# Patient Record
Sex: Male | Born: 1995 | Hispanic: Yes | Marital: Married | Smoking: Never smoker
Health system: Southern US, Community
[De-identification: ages and names within clinical notes are randomized; demographics above are authoritative.]

## PROBLEM LIST (undated history)

## (undated) DIAGNOSIS — G4730 Sleep apnea, unspecified: Secondary | ICD-10-CM

---
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
## 2018-04-20 ENCOUNTER — Encounter: Payer: Self-pay | Admitting: Gynecology

## 2018-04-20 ENCOUNTER — Other Ambulatory Visit: Payer: Self-pay

## 2018-04-20 ENCOUNTER — Ambulatory Visit
Admission: EM | Admit: 2018-04-20 | Discharge: 2018-04-20 | Disposition: A | Discharge status: Home / Self Care | Payer: BLUE CROSS/BLUE SHIELD | Attending: Family Medicine | Admitting: Family Medicine

## 2018-04-20 DIAGNOSIS — J0100 Acute maxillary sinusitis, unspecified: Secondary | ICD-10-CM

## 2018-04-20 MED ORDER — AMOXICILLIN-POT CLAVULANATE 875-125 MG PO TABS: 1.0000 | ORAL_TABLET | Freq: Two times a day (BID) | ORAL | 0 refills | Status: DC

## 2018-04-20 NOTE — ED Triage Notes (Signed)
Per patient with cough x 2 weeks

## 2018-04-20 NOTE — ED Provider Notes (Signed)
[facility] ____________________________________________  Time seen: Approximately 11:24 AM  I have reviewed the triage vital signs and the nursing notes.   HISTORY  Chief Complaint No chief complaint on file.   HPI Domingo CockingLuis A Waddington is a 22 y.o. male present for evaluation of 2 weeks of runny nose, nasal congestion, postnasal drainage and cough.  States cough is day and night very thick yellow mucus out with blowing his nose.  Reports intermittent sinus pressure.  Had sore throat initially, sore throat is since resolved.  States mild sinus discomfort currently.  Continues to eat and drink well.  States he feels like he had a fever last week, but none the last few days.  States his wife is also now sick with some similar complaints.  Denies other known sick contacts.  Has been using multiple over-the-counter cough and congestion medications with some improvement but no resolution.  Denies other relieving factors reports otherwise doing well.  Denies chronic medical problems.  Denies chest pain or shortness of breath.    No past medical history on file. Denies   There are no active problems to display for this patient.   History reviewed. No pertinent surgical history.   No current facility-administered medications for this encounter.   Current Outpatient Medications:  .  amoxicillin-clavulanate (AUGMENTIN) 875-125 MG tablet, Take 1 tablet by mouth every 12 (twelve) hours., Disp: 20 tablet, Rfl: 0  Allergies Patient has no known allergies.  History reviewed. No pertinent family history.  Social History Social History   Tobacco Use  . Smoking status: Never Smoker  . Smokeless tobacco: Never Used  Substance Use Topics  . Alcohol use: Never    Frequency: Never  . Drug use: Never    Review of Systems Constitutional: No fever ENT: As above.  Cardiovascular: Denies chest pain. Respiratory: Denies shortness of breath. Gastrointestinal: No abdominal pain.     Musculoskeletal: Negative for back pain. Skin: Negative for rash.   ____________________________________________   PHYSICAL EXAM:  VITAL SIGNS: ED Triage Vitals  Enc Vitals Group     BP 04/20/18 1111 120/77     Pulse Rate 04/20/18 1111 73     Resp 04/20/18 1111 16     Temp 04/20/18 1111 97.6 F (36.4 C)     Temp Source 04/20/18 1111 Oral     SpO2 04/20/18 1111 100 %     Weight 04/20/18 1113 139 lb (63 kg)     Height 04/20/18 1113 5\' 6"  (1.676 m)     Head Circumference --      Peak Flow --      Pain Score 04/20/18 1112 8     Pain Loc --      Pain Edu? --      Excl. in GC? --     Constitutional: Alert and oriented. Well appearing and in no acute distress. Eyes: Conjunctivae are normal.  Head: Atraumatic.No tenderness to palpation bilateral frontal and maxillary sinuses. No swelling. No erythema.   Ears: no erythema, normal TMs bilaterally.   Nose: nasal congestion with bilateral nasal turbinate erythema and edema.   Mouth/Throat: Mucous membranes are moist. Oropharynx non-erythematous. No tonsillar swelling or exudate.  Neck: No stridor.  No cervical spine tenderness to palpation. Hematological/Lymphatic/Immunilogical: No cervical lymphadenopathy. Cardiovascular: Normal rate, regular rhythm. Grossly normal heart sounds.  Good peripheral circulation. Respiratory: Normal respiratory effort.  No retractions. LNo wheezes, rales or rhonchi. Good air movement.  Musculoskeletal:Steady gait.  Neurologic:  Normal speech and language. No gait  instability. Skin:  Skin is warm, dry and intact. No rash noted. Psychiatric: Mood and affect are normal. Speech and behavior are normal.  ___________________________________________   LABS (all labs ordered are listed, but only abnormal results are displayed)  Labs Reviewed - No data to display ____________________________________________   PROCEDURES Procedures    INITIAL IMPRESSION / ASSESSMENT AND PLAN / ED COURSE  Pertinent  labs & imaging results that were available during my care of the patient were reviewed by me and considered in my medical decision making (see chart for details).  Well appearing patient.  No acute distress.  Concern for secondary sinusitis.  Will treat with oral Augmentin.  Continue over-the-counter cough and congestion medication.Discussed indication, risks and benefits of medications with patient.  Discussed follow up with Primary care physician this week. Discussed follow up and return parameters including no resolution or any worsening concerns. Patient verbalized understanding and agreed to plan.   ____________________________________________   FINAL CLINICAL IMPRESSION(S) / ED DIAGNOSES  Final diagnoses:  Acute maxillary sinusitis, recurrence not specified     ED Discharge Orders         Ordered    amoxicillin-clavulanate (AUGMENTIN) 875-125 MG tablet  Every 12 hours     04/20/18 1122           Note: This dictation was prepared with Dragon dictation along with smaller phrase technology. Any transcriptional errors that result from this process are unintentional.         Renford Dills, NP 04/20/18 1127

## 2018-04-20 NOTE — Discharge Instructions (Addendum)
Take medication as prescribed. Rest. Drink plenty of fluids.  ° °Follow up with your primary care physician this week as needed. Return to Urgent care for new or worsening concerns.  ° °

## 2022-02-28 ENCOUNTER — Other Ambulatory Visit: Payer: Self-pay | Admitting: Nurse Practitioner

## 2022-02-28 DIAGNOSIS — N5089 Other specified disorders of the male genital organs: Secondary | ICD-10-CM

## 2022-03-07 ENCOUNTER — Other Ambulatory Visit: Payer: BLUE CROSS/BLUE SHIELD

## 2022-10-29 ENCOUNTER — Ambulatory Visit
Admission: EM | Admit: 2022-10-29 | Discharge: 2022-10-29 | Disposition: A | Discharge status: Home / Self Care | Payer: BC Managed Care – PPO | Attending: Family | Admitting: Family

## 2022-10-29 ENCOUNTER — Telehealth: Payer: Self-pay | Admitting: Internal Medicine

## 2022-10-29 ENCOUNTER — Ambulatory Visit
Admission: RE | Admit: 2022-10-29 | Discharge: 2022-10-29 | Disposition: A | Discharge status: Home / Self Care | Payer: BC Managed Care – PPO | Source: Ambulatory Visit | Attending: Family | Admitting: Family

## 2022-10-29 DIAGNOSIS — N50811 Right testicular pain: Secondary | ICD-10-CM | POA: Diagnosis not present

## 2022-10-29 DIAGNOSIS — N4342 Spermatocele of epididymis, multiple: Secondary | ICD-10-CM | POA: Diagnosis not present

## 2022-10-29 DIAGNOSIS — I861 Scrotal varices: Secondary | ICD-10-CM | POA: Insufficient documentation

## 2022-10-29 NOTE — Discharge Instructions (Signed)
We have ordered an ultrasound to help determine the cause of your discomfort. We will notify you of the results. If the ultrasound is normal and does not show a specific cause for your pain, we recommend you call a Urologist (information provided) here in [place] for further evaluation.

## 2022-10-29 NOTE — ED Triage Notes (Signed)
Pt c/o R testicle pain x1 mon. States pain getting worse especially when moving around, denies any heavy lifting or injury to area.

## 2022-10-29 NOTE — ED Provider Notes (Signed)
[facility]    CSN: [id number] Arrival date & time: 10/29/22  1021      History   Chief Complaint Chief Complaint  Patient presents with   Testicle Pain    HPI Bryce Levy is a 27 y.o. male.   27 year old male presents with right upper testicular discomfort for the past month. He has noticed occasional pain/tugging sensation with certain movements, especially in the past few days when trying to throw a baseball with his son. He denies any distinct trauma or injury to the area. No distinct heavy lifting. No dysuria, hematuria or penile discharge. No visible masses. He has not taken any medication for symptoms. He did try applying ice and cool compresses to the area with some relief. No other chronic health issues. Takes no daily medication.   The history is provided by the patient.    History reviewed. No pertinent past medical history.  There are no problems to display for this patient.   History reviewed. No pertinent surgical history.     Home Medications    Prior to Admission medications   Not on File    Family History History reviewed. No pertinent family history.  Social History Social History   Tobacco Use   Smoking status: Never   Smokeless tobacco: Never  Substance Use Topics   Alcohol use: Never   Drug use: Never     Allergies   Patient has no known allergies.   Review of Systems Review of Systems  Constitutional:  Negative for activity change, appetite change, chills, diaphoresis, fatigue and fever.  Gastrointestinal:  Negative for abdominal pain, nausea and vomiting.  Genitourinary:  Positive for testicular pain (right only). Negative for decreased urine volume, difficulty urinating, dysuria, flank pain, frequency, genital sores, hematuria, penile discharge, penile pain, penile swelling, scrotal swelling and urgency.  Musculoskeletal:  Negative for arthralgias, back pain and myalgias.  Skin:  Negative for color change, rash and  wound.  Allergic/Immunologic: Negative for environmental allergies, food allergies and immunocompromised state.  Neurological:  Negative for dizziness, tremors, seizures, syncope, weakness, light-headedness, numbness and headaches.  Hematological:  Negative for adenopathy. Does not bruise/bleed easily.     Physical Exam Triage Vital Signs ED Triage Vitals  Enc Vitals Group     BP 10/29/22 1026 (!) 145/73     Pulse Rate 10/29/22 1026 70     Resp 10/29/22 1026 16     Temp 10/29/22 1026 98.6 F (37 C)     Temp Source 10/29/22 1026 Oral     SpO2 10/29/22 1026 95 %     Weight 10/29/22 1026 150 lb (68 kg)     Height 10/29/22 1026 5\' 6"  (1.676 m)     Head Circumference --      Peak Flow --      Pain Score 10/29/22 1030 8     Pain Loc --      Pain Edu? --      Excl. in GC? --    No data found.  Updated Vital Signs BP (!) 145/73 (BP Location: Left Arm)   Pulse 70   Temp 98.6 F (37 C) (Oral)   Resp 16   Ht 5\' 6"  (1.676 m)   Wt 150 lb (68 kg)   SpO2 95%   BMI 24.21 kg/m   Visual Acuity Right Eye Distance:   Left Eye Distance:   Bilateral Distance:    Right Eye Near:   Left Eye Near:  Bilateral Near:     Physical Exam Vitals and nursing note reviewed. Exam conducted with a chaperone present Lars Mage, CMA was present during genital exam).  Constitutional:      General: He is awake. He is not in acute distress.    Appearance: He is well-developed and well-groomed. He is not ill-appearing.     Comments: He is sitting in the exam chair in no acute distress.   HENT:     Head: Normocephalic and atraumatic.     Right Ear: Hearing normal.     Left Ear: Hearing normal.  Cardiovascular:     Rate and Rhythm: Normal rate.  Pulmonary:     Effort: Pulmonary effort is normal.  Abdominal:     General: Abdomen is flat. Bowel sounds are normal. There is no distension.     Palpations: Abdomen is soft. There is no mass.     Tenderness: There is no abdominal tenderness. There is no  right CVA tenderness, left CVA tenderness, guarding or rebound.     Hernia: No hernia is present. There is no hernia in the left inguinal area or right inguinal area.  Genitourinary:    Pubic Area: No rash.      Penis: Normal and circumcised. No erythema, tenderness, discharge, swelling or lesions.      Testes:        Right: Tenderness present. Mass, swelling or varicocele not present. Right testis is descended.        Left: Mass, tenderness, swelling or varicocele not present. Left testis is descended.     Epididymis:     Right: Normal.     Left: Normal.     Comments: No distinct hernia present. Tender along lower right inguinal canal into right upper scrotum and right testicle. No distinct masses present. No rash or lesions.  Musculoskeletal:        General: Normal range of motion.  Lymphadenopathy:     Lower Body: No right inguinal adenopathy. No left inguinal adenopathy.  Skin:    General: Skin is warm and dry.     Findings: No erythema or rash.  Neurological:     General: No focal deficit present.     Mental Status: He is alert and oriented to person, place, and time.  Psychiatric:        Mood and Affect: Mood normal.        Behavior: Behavior normal. Behavior is cooperative.        Thought Content: Thought content normal.        Judgment: Judgment normal.      UC Treatments / Results  Labs (all labs ordered are listed, but only abnormal results are displayed) Labs Reviewed - No data to display  EKG   Radiology US SCROTUM W/DOPPLER  Result Date: 10/29/2022 CLINICAL DATA:  27 year old with right scrotal pain for 1 month, worsening over the last 3 days. EXAM: SCROTAL ULTRASOUND DOPPLER ULTRASOUND OF THE TESTICLES TECHNIQUE: Complete ultrasound examination of the testicles, epididymis, and other scrotal structures was performed. Color and spectral Doppler ultrasound were also utilized to evaluate blood flow to the testicles. COMPARISON:  None Available. FINDINGS: Right  testicle Measurements: 4.2 x 2.4 x 2.2 cm. No mass or microlithiasis visualized. Normal blood flow with color Doppler. Left testicle Measurements: 3.9 x 1.9 x 2.9 cm. No mass or microlithiasis visualized. Normal blood flow with color Doppler. Right epididymis: Small epididymal cysts or spermatoceles, measuring up to 7 mm in diameter. Otherwise unremarkable. Left epididymis: Small epididymal  cysts or spermatoceles, measuring up to 12 mm in diameter. Otherwise unremarkable. Hydrocele:  None visualized. Varicocele:  Small left-sided varicocele. Pulsed Doppler interrogation of both testes demonstrates normal low resistance arterial and venous waveforms bilaterally. IMPRESSION: 1. No evidence of testicular torsion.  Both testes appear normal. 2. Small bilateral epididymal cysts or spermatoceles. 3. Small left-sided varicocele. Electronically Signed   By: Carey Bullocks M.D.   On: 10/29/2022 16:31    Procedures Procedures (including critical care time)  Medications Ordered in UC Medications - No data to display  Initial Impression / Assessment and Plan / UC Course  I have reviewed the triage vital signs and the nursing notes.  Pertinent labs & imaging results that were available during my care of the patient were reviewed by me and considered in my medical decision making (see chart for details).     Discussed with patient that we will order a ultrasound of the scrotum to help determine any cause of his discomfort. Patient will be worked into the schedule at [employer] this afternoon. If a more emergent cause of his symptoms is determined by ultrasound, we will notify him today of the results and may either send him to the ER or to a surgeon. If his ultrasound is normal or a non-emergent finding is present, we will notify him tomorrow 6/18 and he may need to see a Urologist (information provided).   Final Clinical Impressions(s) / UC Diagnoses   Final diagnoses:  Right testicular pain      Discharge Instructions      We have ordered an ultrasound to help determine the cause of your discomfort. We will notify you of the results. If the ultrasound is normal and does not show a specific cause for your pain, we recommend you call a Urologist (information provided) here in [place] for further evaluation.     ED Prescriptions   None    PDMP not reviewed this encounter.   Sudie Grumbling, NP 10/29/22 2111

## 2022-10-29 NOTE — Telephone Encounter (Signed)
Patient did not pick up his phone when I called today 10/29/2022 at 4:58 PM.  I was hoping to update the patient with the results of the testicular ultrasound.

## 2022-10-30 NOTE — Telephone Encounter (Signed)
Per Dewayne Hatch, APP "his Ultrasound did show a small Varicocele (basically like a varicose vein) on the right side which may be contributing to his pain. It also showed small bilateral epididymal cysts which are usually not concerning but still should be monitored. No results requiring urgent evaluation. I would recommend he follow-up with a Urologist for further evaluation. "  Reviewed with patient, he verbalized understanding

## 2022-11-09 ENCOUNTER — Ambulatory Visit
Admission: EM | Admit: 2022-11-09 | Discharge: 2022-11-09 | Disposition: A | Discharge status: Home / Self Care | Payer: BC Managed Care – PPO | Attending: Emergency Medicine | Admitting: Emergency Medicine

## 2022-11-09 ENCOUNTER — Other Ambulatory Visit: Payer: Self-pay

## 2022-11-09 DIAGNOSIS — L03115 Cellulitis of right lower limb: Secondary | ICD-10-CM | POA: Diagnosis not present

## 2022-11-09 DIAGNOSIS — W57XXXA Bitten or stung by nonvenomous insect and other nonvenomous arthropods, initial encounter: Secondary | ICD-10-CM

## 2022-11-09 DIAGNOSIS — N50811 Right testicular pain: Secondary | ICD-10-CM

## 2022-11-09 DIAGNOSIS — S80861A Insect bite (nonvenomous), right lower leg, initial encounter: Secondary | ICD-10-CM

## 2022-11-09 MED ORDER — DOXYCYCLINE HYCLATE 100 MG PO CAPS: 100.0000 mg | ORAL_CAPSULE | Freq: Two times a day (BID) | ORAL | 0 refills | Status: DC

## 2022-11-09 MED ORDER — TRIAMCINOLONE ACETONIDE 0.5 % EX OINT: 1.0000 | TOPICAL_OINTMENT | Freq: Two times a day (BID) | CUTANEOUS | 0 refills | Status: DC

## 2022-11-09 NOTE — ED Provider Notes (Signed)
[facility]    CSN: [id number] Arrival date & time: 11/09/22  1850      History   Chief Complaint Chief Complaint  Patient presents with   Insect Bite    HPI Bryce Levy is a 27 y.o. male.   HPI  27 year old male with no significant past medical history presents for evaluation of an itchy, painful, red insect bite to the back of his right calf.  He states he was walking through tall grass and shorts several days ago and he felt a sting on the back of his leg but he did not see what bit him.  The stinging lasted approximately a minute.  The area has continued to be red, tender, and itching.  He denies any fever or drainage from the area.  History reviewed. No pertinent past medical history.  There are no problems to display for this patient.   History reviewed. No pertinent surgical history.     Home Medications    Prior to Admission medications   Medication Sig Start Date End Date Taking? Authorizing Provider  doxycycline (VIBRAMYCIN) 100 MG capsule Take 1 capsule (100 mg total) by mouth 2 (two) times daily. 11/09/22  Yes Becky Augusta, NP  triamcinolone ointment (KENALOG) 0.5 % Apply 1 Application topically 2 (two) times daily. 11/09/22  Yes Becky Augusta, NP    Family History History reviewed. No pertinent family history.  Social History Social History   Tobacco Use   Smoking status: Never   Smokeless tobacco: Never  Substance Use Topics   Alcohol use: Never   Drug use: Never     Allergies   Patient has no known allergies.   Review of Systems Review of Systems  Constitutional:  Negative for fever.  Skin:  Positive for color change and wound.     Physical Exam Triage Vital Signs ED Triage Vitals [11/09/22 1901]  Enc Vitals Group     BP      Pulse      Resp      Temp      Temp src      SpO2      Weight 152 lb (68.9 kg)     Height 5\' 6"  (1.676 m)     Head Circumference      Peak Flow      Pain Score 5     Pain Loc      Pain  Edu?      Excl. in GC?    No data found.  Updated Vital Signs BP 124/77 (BP Location: Left Arm)   Pulse 76   Temp 99.1 F (37.3 C) (Oral)   Ht 5\' 6"  (1.676 m)   Wt 152 lb (68.9 kg)   SpO2 95%   BMI 24.53 kg/m   Visual Acuity Right Eye Distance:   Left Eye Distance:   Bilateral Distance:    Right Eye Near:   Left Eye Near:    Bilateral Near:     Physical Exam Vitals and nursing note reviewed.  Constitutional:      Appearance: Normal appearance. He is not ill-appearing.  HENT:     Head: Normocephalic and atraumatic.  Skin:    General: Skin is warm and dry.     Capillary Refill: Capillary refill takes less than 2 seconds.     Findings: Erythema and lesion present.  Neurological:     General: No focal deficit present.     Mental Status: He is alert and oriented to  person, place, and time.  Psychiatric:        Mood and Affect: Mood normal.        Behavior: Behavior normal.        Thought Content: Thought content normal.        Judgment: Judgment normal.      UC Treatments / Results  Labs (all labs ordered are listed, but only abnormal results are displayed) Labs Reviewed - No data to display  EKG   Radiology No results found.  Procedures Procedures (including critical care time)  Medications Ordered in UC Medications - No data to display  Initial Impression / Assessment and Plan / UC Course  I have reviewed the triage vital signs and the nursing notes.  Pertinent labs & imaging results that were available during my care of the patient were reviewed by me and considered in my medical decision making (see chart for details).   Patient is a pleasant, nontoxic-appearing 2 old male presenting for evaluation of an insect bite to the back of his right calf as outlined in HPI above.  There is an erythematous area as you can see in image above that does not have any induration or fluctuance though it is hot to touch and tender.  It is unclear as to what might  admit the patient but the erythema and warmth is concerning for developing cellulitis.  I will start him on doxycycline 100 mg twice daily for 10 days to cover for the developing cellulitis.  I will also prescribe triamcinolone 0.5% ointment that he can apply twice daily to help with itching and inflammation.  Return precautions reviewed.  Final Clinical Impressions(s) / UC Diagnoses   Final diagnoses:  Cellulitis of right lower extremity  Insect bite of right lower leg, initial encounter     Discharge Instructions      Take the Doxycycline twice daily with food for 10 days.  Doxycycline will make you more sensitive to sunburn so wear sunscreen when outdoors and reapply it every 90 minutes.  Apply warm compresses to help promote drainage.  Use OTC Tylenol and Ibuprofen according to the package instructions as needed for pain.  Apply the Triamcinolone ointment twice daily to help with pain, itch, and inflammation.  Return for new or worsening symptoms.       ED Prescriptions     Medication Sig Dispense Auth. Provider   doxycycline (VIBRAMYCIN) 100 MG capsule Take 1 capsule (100 mg total) by mouth 2 (two) times daily. 20 capsule Becky Augusta, NP   triamcinolone ointment (KENALOG) 0.5 % Apply 1 Application topically 2 (two) times daily. 30 g Becky Augusta, NP      PDMP not reviewed this encounter.   Becky Augusta, NP 11/09/22 [phone]

## 2022-11-09 NOTE — Discharge Instructions (Signed)
Take the Doxycycline twice daily with food for 10 days.  Doxycycline will make you more sensitive to sunburn so wear sunscreen when outdoors and reapply it every 90 minutes.  Apply warm compresses to help promote drainage.  Use OTC Tylenol and Ibuprofen according to the package instructions as needed for pain.  Apply the Triamcinolone ointment twice daily to help with pain, itch, and inflammation.  Return for new or worsening symptoms.

## 2022-11-09 NOTE — ED Triage Notes (Signed)
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

## 2022-11-13 ENCOUNTER — Ambulatory Visit (INDEPENDENT_AMBULATORY_CARE_PROVIDER_SITE_OTHER): Payer: BC Managed Care – PPO | Admitting: Urology

## 2022-11-13 ENCOUNTER — Other Ambulatory Visit
Admission: RE | Admit: 2022-11-13 | Discharge: 2022-11-13 | Disposition: A | Discharge status: Home / Self Care | Payer: BC Managed Care – PPO | Attending: Urology | Admitting: Urology

## 2022-11-13 ENCOUNTER — Encounter: Payer: Self-pay | Admitting: Urology

## 2022-11-13 VITALS — BP 108/73 | HR 69 | Ht 66.0 in | Wt 155.0 lb

## 2022-11-13 DIAGNOSIS — N50811 Right testicular pain: Secondary | ICD-10-CM | POA: Diagnosis present

## 2022-11-13 DIAGNOSIS — R109 Unspecified abdominal pain: Secondary | ICD-10-CM

## 2022-11-13 DIAGNOSIS — R102 Pelvic and perineal pain: Secondary | ICD-10-CM | POA: Diagnosis not present

## 2022-11-13 LAB — URINALYSIS, COMPLETE (UACMP) WITH MICROSCOPIC
Bilirubin Urine: NEGATIVE
Glucose, UA: NEGATIVE mg/dL
Hgb urine dipstick: NEGATIVE
Ketones, ur: NEGATIVE mg/dL
Leukocytes,Ua: NEGATIVE
Nitrite: NEGATIVE
Protein, ur: NEGATIVE mg/dL
Specific Gravity, Urine: 1.02 (ref 1.005–1.030)
pH: 7 (ref 5.0–8.0)

## 2022-11-13 MED ORDER — CELECOXIB 200 MG PO CAPS: 200.0000 mg | ORAL_CAPSULE | Freq: Two times a day (BID) | ORAL | 0 refills | Status: DC

## 2022-11-13 NOTE — Patient Instructions (Signed)
 Pelvic Floor Dysfunction, Male     Pelvic floor dysfunction (PFD) is a condition that results when the group of muscles and connective tissues that support the organs in the pelvis (pelvic floor muscles) do not work well. These muscles and their connections form a sling that supports the colon and bladder. In men, these muscles also support the prostate gland. PFD causes pelvic floor muscles to be too weak, too tight, or both. In PFD, muscle movements are not coordinated. This may cause bowel or bladder problems. It may also cause pain. What are the causes? This condition may be caused by an injury to the pelvic area or by a weakening of pelvic muscles. In many cases, the exact cause is not known. What increases the risk? The following factors may make you more likely to develop PFD: Having chronic bladder tissue inflammation (interstitial cystitis). Being an older person. Being overweight. History of radiation treatment for cancer in the pelvic region. Previous pelvic surgery, such as removal of the prostate gland (prostatectomy). What are the signs or symptoms? Symptoms of this condition vary and may include: Bladder symptoms, such as: Trouble starting urination and emptying the bladder. Frequent urinary tract infections. Leaking urine when coughing, laughing, or exercising (stress incontinence). Having to pass urine urgently or frequently. Pain when passing urine. Bowel symptoms, such as: Constipation. Urgent or frequent bowel movements. Incomplete bowel movements. Painful bowel movements. Leaking stool or gas. Unexplained genital or rectal pain. Genital or rectal muscle spasms. Low back pain. Sexual dysfunction, such as erectile dysfunction, premature ejaculation, or pain during or after sexual activity. How is this diagnosed? This condition is diagnosed based on: Your symptoms and medical history. A physical exam. During the exam, your health care provider may check your  pelvic muscles for tightness, spasm, pain, or weakness. This may include a rectal exam. In some cases, you may have diagnostic tests, such as: Electrical muscle function tests. Urine flow testing. X-ray tests of bowel function. Ultrasound of the pelvic organs. How is this treated? Treatment for this condition depends on your symptoms. Treatment options include: Physical therapy. This may include Kegel exercises to help relax or strengthen the pelvic floor muscles. Biofeedback. This type of therapy provides feedback on how tight your pelvic floor muscles are so that you can learn to control them. Massage therapy. A treatment that involves electrical stimulation of the pelvic floor muscles to help control pain (transcutaneous electrical nerve stimulation, or TENS). Sound wave therapy (ultrasound) to reduce muscle spasms. Medicines, such as: Muscle relaxants. Bladder control medicines. Surgery to reconstruct or support pelvic floor muscles may be an option if other treatments do not help. Follow these instructions at home: Activity Do your usual activities as told by your health care provider. Ask your health care provider if you should modify any activities. Do pelvic floor strengthening or relaxing exercises at home as told by your physical therapist. Lifestyle Maintain a healthy weight. Eat foods that are high in fiber, such as beans, whole grains, and fresh fruits and vegetables. Limit foods that are high in fat and processed sugars, such as fried or sweet foods. Manage stress with relaxation techniques such as yoga or meditation. General instructions If you have problems with leakage: Use absorbable pads or wear padded underwear. Wash your genital and anal area frequently with mild soap. Keep your genital and anal area as clean and dry as possible. Ask your health care provider if you should try a barrier cream to prevent skin irritation. Take warm   baths to relieve pelvic muscle  tension or spasms. Take over-the-counter and prescription medicines only as told by your health care provider. Keep all follow-up visits. How is this prevented? The cause of PFD is not always known, but there are a few things you can do to reduce the risk of developing this condition, including: Staying at a healthy weight. Getting regular exercise. Managing stress. Contact a health care provider if: Your symptoms are not improving with home care. You have signs or symptoms of PFD that get worse. You develop new signs or symptoms. You have signs of a urinary tract infection, such as: Fever. Chills. Increased urinary frequency. A burning feeling when urinating. You have not had a bowel movement in 3 days (constipation). Summary Pelvic floor dysfunction results when the muscles and connective tissues in your pelvic floor do not work well. These muscles and their connections form a sling that supports your colon and bladder. In men, these muscles also support the prostate gland. PFD may be caused by an injury to the pelvic area or by a weakening of pelvic muscles. PFD causes pelvic floor muscles to be too weak, too tight, or a combination of both. Symptoms may vary from person to person. In most cases, PFD can be treated with physical therapies and medicines. Surgery may be an option if other treatments do not help. This information is not intended to replace advice given to you by your health care provider. Make sure you discuss any questions you have with your health care provider. Document Revised: 09/07/2020 Document Reviewed: 09/07/2020 Elsevier Patient Education  2024 Elsevier Inc.  

## 2022-11-13 NOTE — Progress Notes (Signed)
   11/13/22 1:27 PM   Bryce Levy 28-Jan-1996 [id number]  CC: Right groin and scrotal pain  HPI: 27 year old male who reports 1 to 2 months of right-sided scrotal and groin pain, that seems to have worsened over the last month.  This is most notable during physical activity like running or throwing a baseball.  He had a scrotal ultrasound done in urgent care which was benign aside from some small epididymal cyst and a small left-sided varicocele.  He denies any urinary symptoms or gross hematuria.  He was recently treated for a possible infected bug bite with doxycycline, which he thinks may have helped the scrotal pain somewhat.  Urinalysis today is pending  Social History:  reports that he has never smoked. He has never been exposed to tobacco smoke. He has never used smokeless tobacco. He reports that he does not drink alcohol and does not use drugs.  Physical Exam: BP 108/73   Pulse 69   Ht 5\' 6"  (1.676 m)   Wt 155 lb (70.3 kg)   BMI 25.02 kg/m    Constitutional:  Alert and oriented, No acute distress. Cardiovascular: No clubbing, cyanosis, or edema. Respiratory: Normal respiratory effort, no increased work of breathing. GI: Abdomen is soft, nontender, nondistended, no abdominal masses GU: Phallus with patent meatus, no lesions, testicles 20 cc and descended bilaterally without masses, no testicular tenderness, mild right inguinal tenderness  Pertinent Imaging: I have personally viewed and interpreted the scrotal ultrasound showing no evidence of testicular mass or torsion, small bilateral epididymal cyst, small left-sided varicocele, no evidence of hernia.  Assessment & Plan:   27 year old male with intermittent right-sided groin pain with physical activity over the last 1 to 2 months, symptoms most consistent with pelvic floor dysfunction.  I recommended a trial of Celebrex, and pelvic floor stretching exercises were provided, if no improvement would recommend referral to  pelvic floor physical therapy.  Could consider trial of gabapentin or amitriptyline if persistent symptoms.  Reassurance was provided regarding normal scrotal ultrasound findings.  Return precautions discussed  Pelvic floor stretching exercises provided, trial of Celebrex 200 mg twice daily x 10 days Pelvic floor PT referral if no improvement, or consider trial of gabapentin in the future  Legrand Rams, MD 11/13/2022  [facility] [address], [address] [place], [place] [address] [phone]

## 2023-01-09 ENCOUNTER — Ambulatory Visit: Payer: BC Managed Care – PPO | Admitting: Urology

## 2023-01-16 ENCOUNTER — Ambulatory Visit (INDEPENDENT_AMBULATORY_CARE_PROVIDER_SITE_OTHER): Payer: BC Managed Care – PPO | Admitting: Surgery

## 2023-01-16 ENCOUNTER — Encounter: Payer: Self-pay | Admitting: Surgery

## 2023-01-16 VITALS — BP 130/80 | HR 75 | Temp 98.5°F | Ht 66.0 in | Wt 156.0 lb

## 2023-01-16 DIAGNOSIS — K4090 Unilateral inguinal hernia, without obstruction or gangrene, not specified as recurrent: Secondary | ICD-10-CM | POA: Diagnosis not present

## 2023-01-16 NOTE — Patient Instructions (Addendum)
You have chose to have your hernia repaired. This will be done by Dr. Hampton Abbot at [facility].  Please see your (blue) Pre-care information that you have been given today. Our surgery scheduler will call you to verify surgery date and to go over information.   You will need to arrange to be out of work for approximately 1-2 weeks and then you may return with a lifting restriction for 4 more weeks. If you have FMLA or Disability paperwork that needs to be filled out, please have your company fax your paperwork to [phone] or you may drop this by either office. This paperwork will be filled out within 3 days after your surgery has been completed.  You may have a bruise in your groin and also swelling and brusing in your testicle area. You may use ice 4-5 times daily for 15-20 minutes each time. Make sure that you place a barrier between you and the ice pack. To decrease the swelling, you may roll up a bath towel and place it vertically in between your thighs with your testicles resting on the towel. You will want to keep this area elevated as much as possible for several days following surgery.    Inguinal Hernia, Adult Muscles help keep everything in the body in its proper place. But if a weak spot in the muscles develops, something can poke through. That is called a hernia. When this happens in the lower part of the belly (abdomen), it is called an inguinal hernia. (It takes its name from a part of the body in this region called the inguinal canal.) A weak spot in the wall of muscles lets some fat or part of the small intestine bulge through. An inguinal hernia can develop at any age. Men get them more often than women. CAUSES  In adults, an inguinal hernia develops over time. It can be triggered by: Suddenly straining the muscles of the lower abdomen. Lifting heavy objects. Straining to have a bowel movement. Difficult bowel movements (constipation) can lead to this. Constant coughing. This may be  caused by smoking or lung disease. Being overweight. Being pregnant. Working at a job that requires long periods of standing or heavy lifting. Having had an inguinal hernia before. One type can be an emergency situation. It is called a strangulated inguinal hernia. It develops if part of the small intestine slips through the weak spot and cannot get back into the abdomen. The blood supply can be cut off. If that happens, part of the intestine may die. This situation requires emergency surgery. SYMPTOMS  Often, a small inguinal hernia has no symptoms. It is found when a healthcare provider does a physical exam. Larger hernias usually have symptoms.  In adults, symptoms may include: A lump in the groin. This is easier to see when the person is standing. It might disappear when lying down. In men, a lump in the scrotum. Pain or burning in the groin. This occurs especially when lifting, straining or coughing. A dull ache or feeling of pressure in the groin. Signs of a strangulated hernia can include: A bulge in the groin that becomes very painful and tender to the touch. A bulge that turns red or purple. Fever, nausea and vomiting. Inability to have a bowel movement or to pass gas. DIAGNOSIS  To decide if you have an inguinal hernia, a healthcare provider will probably do a physical examination. This will include asking questions about any symptoms you have noticed. The healthcare provider might feel  the groin area and ask you to cough. If an inguinal hernia is felt, the healthcare provider may try to slide it back into the abdomen. Usually no other tests are needed. TREATMENT  Treatments can vary. The size of the hernia makes a difference. Options include: Watchful waiting. This is often suggested if the hernia is small and you have had no symptoms. No medical procedure will be done unless symptoms develop. You will need to watch closely for symptoms. If any occur, contact your healthcare  provider right away. Surgery. This is used if the hernia is larger or you have symptoms. Open surgery. This is usually an outpatient procedure (you will not stay overnight in a hospital). An cut (incision) is made through the skin in the groin. The hernia is put back inside the abdomen. The weak area in the muscles is then repaired by herniorrhaphy or hernioplasty. Herniorrhaphy: in this type of surgery, the weak muscles are sewn back together. Hernioplasty: a patch or mesh is used to close the weak area in the abdominal wall. Laparoscopy. In this procedure, a surgeon makes small incisions. A thin tube with a tiny video camera (called a laparoscope) is put into the abdomen. The surgeon repairs the hernia with mesh by looking with the video camera and using two long instruments. HOME CARE INSTRUCTIONS  After surgery to repair an inguinal hernia: You will need to take pain medicine prescribed by your healthcare provider. Follow all directions carefully. You will need to take care of the wound from the incision. Your activity will be restricted for awhile. This will probably include no heavy lifting for several weeks. You also should not do anything too active for a few weeks. When you can return to work will depend on the type of job that you have. During "watchful waiting" periods, you should: Maintain a healthy weight. Eat a diet high in fiber (fruits, vegetables and whole grains). Drink plenty of fluids to avoid constipation. This means drinking enough water and other liquids to keep your urine clear or pale yellow. Do not lift heavy objects. Do not stand for long periods of time. Quit smoking. This should keep you from developing a frequent cough. SEEK MEDICAL CARE IF:  A bulge develops in your groin area. You feel pain, a burning sensation or pressure in the groin. This might be worse if you are lifting or straining. You develop a fever of more than 100.5 F (38.1 C). SEEK IMMEDIATE MEDICAL  CARE IF:  Pain in the groin increases suddenly. A bulge in the groin gets bigger suddenly and does not go down. For men, there is sudden pain in the scrotum. Or, the size of the scrotum increases. A bulge in the groin area becomes red or purple and is painful to touch. You have nausea or vomiting that does not go away. You feel your heart beating much faster than normal. You cannot have a bowel movement or pass gas. You develop a fever of more than 102.0 F (38.9 C).   This information is not intended to replace advice given to you by your health care provider. Make sure you discuss any questions you have with your health care provider.   Document Released: 09/16/2008 Document Revised: 07/23/2011 Document Reviewed: 11/01/2014 Elsevier Interactive Patient Education [employer].

## 2023-01-16 NOTE — Progress Notes (Signed)
01/16/2023  Reason for Visit: Right inguinal hernia  History of Present Illness: Bryce Levy is a 27 y.o. male presenting for evaluation of a right inguinal hernia.  Patient reports that she is had an area of pain/bulging in the right groin/testicle for the last few months.  He works for [employer] and has to climb up ladders quite a bit and carries some heavy equipment.  He had first noticed burning/pain towards the testicle and at first was not sure if this could be a pulled muscle or not.  He went to urgent care on 10/29/2022 and had an ultrasound of the scrotum which did not show any abnormalities.  He was referred to urology and saw Dr. Richardo Hanks on 11/13/2022.  On his exam, there were no palpable abnormalities and was recommended to take Celebrex to help with potential musculoskeletal issue.  However he continued to not improve and later he started feeling more of a bulging sensation as well.  The patient reports that the bulging occurs whenever he is doing anything active and he feels a burning sensation/pain in the right groin towards the testicle during these episodes.  When the bulging comes out, he is able to push it in.  He denies any severe pain and denies any pain in the left groin or bulging sensation in the left groin.  Past Medical History: History reviewed. No pertinent past medical history.   Past Surgical History: History reviewed. No pertinent surgical history.  Home Medications: Prior to Admission medications   Not on File    Allergies: No Known Allergies  Social History:  reports that he has never smoked. He has never been exposed to tobacco smoke. He has never used smokeless tobacco. He reports that he does not drink alcohol and does not use drugs.   Family History: History reviewed. No pertinent family history.  Review of Systems: Review of Systems  Constitutional:  Negative for chills and fever.  HENT:  Negative for hearing loss.   Respiratory:  Negative for shortness  of breath.   Cardiovascular:  Negative for chest pain.  Gastrointestinal:  Positive for abdominal pain. Negative for nausea and vomiting.  Genitourinary:  Negative for dysuria.  Musculoskeletal:  Negative for myalgias.  Skin:  Negative for rash.  Neurological:  Negative for dizziness.  Psychiatric/Behavioral:  Negative for depression.     Physical Exam BP 130/80   Pulse 75   Temp 98.5 F (36.9 C) (Oral)   Ht 5\' 6"  (1.676 m)   Wt 156 lb (70.8 kg)   SpO2 97%   BMI 25.18 kg/m  CONSTITUTIONAL: No acute distress, well-nourished HEENT:  Normocephalic, atraumatic, extraocular motion intact. NECK: Trachea is midline, and there is no jugular venous distension.  RESPIRATORY:  Lungs are clear, and breath sounds are equal bilaterally. Normal respiratory effort without pathologic use of accessory muscles. CARDIOVASCULAR: Heart is regular without murmurs, gallops, or rubs. GI: The abdomen is soft, nondistended, with discomfort to palpation in the right groin.  The patient does have a reducible right inguinal hernia.  No palpable hernia on the left side.   MUSCULOSKELETAL:  Normal muscle strength and tone in all four extremities.  No peripheral edema or cyanosis. SKIN: Skin turgor is normal. There are no pathologic skin lesions.  NEUROLOGIC:  Motor and sensation is grossly normal.  Cranial nerves are grossly intact. PSYCH:  Alert and oriented to person, place and time. Affect is normal.  Laboratory Analysis: No results found for this or any previous visit (from the  past 24 hour(s)).  Imaging: Ultrasound scrotum on 10/29/2022: IMPRESSION: 1. No evidence of testicular torsion.  Both testes appear normal. 2. Small bilateral epididymal cysts or spermatoceles. 3. Small left-sided varicocele.  Assessment and Plan: This is a 27 y.o. male with a right inguinal hernia.  - Discussed with the patient the findings on exam.  I did take him a while with doing some physical exertion for the bulging to  protrude more so I could examine him better.  On initial exam, I could not palpate any defects but after she did physical activity for a few minutes, the bulging was more noticeable.  Indeed he does have a reducible right inguinal hernia.  No hernia palpable on the left groin or at the umbilicus. - Discussed with him how hernias can form and that unfortunately is no medical treatment or exercise that can be used to repair the defect.  Would recommend surgical management in the form of a right inguinal hernia repair.  Patient is in agreement.  Discussed with him that this can be done via minimally invasive robotic fashion which will allow [place] to see and evaluate the left groin as well. - Discussed with him then the plan for robotic assisted right inguinal hernia repair.  Reviewed with him the surgery at length including the planned incisions, the risks of bleeding, infection, injury to surrounding structures, that this would be an outpatient procedure, the use of mesh to patch the defect on his groin, the ability to evaluate the left groin and repair as needed at the same setting, postoperative activity restrictions, pain control, and he is willing to proceed. - We will tentatively schedule the patient for surgery on 01/24/2023.  The patient will contact his job and discussed with his wife and he is aware that if we need to push the surgery date out that it is okay to do so.  All of his questions have been answered.  I spent 45 minutes dedicated to the care of this patient on the date of this encounter to include pre-visit review of records, face-to-face time with the patient discussing diagnosis and management, and any post-visit coordination of care.   Howie Ill, MD [facility]

## 2023-01-16 NOTE — H&P (View-Only) (Signed)
01/16/2023  Reason for Visit: Right inguinal hernia  History of Present Illness: Bryce Levy is a 27 y.o. male presenting for evaluation of a right inguinal hernia.  Patient reports that she is had an area of pain/bulging in the right groin/testicle for the last few months.  He works for [employer] and has to climb up ladders quite a bit and carries some heavy equipment.  He had first noticed burning/pain towards the testicle and at first was not sure if this could be a pulled muscle or not.  He went to urgent care on 10/29/2022 and had an ultrasound of the scrotum which did not show any abnormalities.  He was referred to urology and saw Dr. Richardo Hanks on 11/13/2022.  On his exam, there were no palpable abnormalities and was recommended to take Celebrex to help with potential musculoskeletal issue.  However he continued to not improve and later he started feeling more of a bulging sensation as well.  The patient reports that the bulging occurs whenever he is doing anything active and he feels a burning sensation/pain in the right groin towards the testicle during these episodes.  When the bulging comes out, he is able to push it in.  He denies any severe pain and denies any pain in the left groin or bulging sensation in the left groin.  Past Medical History: History reviewed. No pertinent past medical history.   Past Surgical History: History reviewed. No pertinent surgical history.  Home Medications: Prior to Admission medications   Not on File    Allergies: No Known Allergies  Social History:  reports that he has never smoked. He has never been exposed to tobacco smoke. He has never used smokeless tobacco. He reports that he does not drink alcohol and does not use drugs.   Family History: History reviewed. No pertinent family history.  Review of Systems: Review of Systems  Constitutional:  Negative for chills and fever.  HENT:  Negative for hearing loss.   Respiratory:  Negative for shortness  of breath.   Cardiovascular:  Negative for chest pain.  Gastrointestinal:  Positive for abdominal pain. Negative for nausea and vomiting.  Genitourinary:  Negative for dysuria.  Musculoskeletal:  Negative for myalgias.  Skin:  Negative for rash.  Neurological:  Negative for dizziness.  Psychiatric/Behavioral:  Negative for depression.     Physical Exam BP 130/80   Pulse 75   Temp 98.5 F (36.9 C) (Oral)   Ht 5\' 6"  (1.676 m)   Wt 156 lb (70.8 kg)   SpO2 97%   BMI 25.18 kg/m  CONSTITUTIONAL: No acute distress, well-nourished HEENT:  Normocephalic, atraumatic, extraocular motion intact. NECK: Trachea is midline, and there is no jugular venous distension.  RESPIRATORY:  Lungs are clear, and breath sounds are equal bilaterally. Normal respiratory effort without pathologic use of accessory muscles. CARDIOVASCULAR: Heart is regular without murmurs, gallops, or rubs. GI: The abdomen is soft, nondistended, with discomfort to palpation in the right groin.  The patient does have a reducible right inguinal hernia.  No palpable hernia on the left side.   MUSCULOSKELETAL:  Normal muscle strength and tone in all four extremities.  No peripheral edema or cyanosis. SKIN: Skin turgor is normal. There are no pathologic skin lesions.  NEUROLOGIC:  Motor and sensation is grossly normal.  Cranial nerves are grossly intact. PSYCH:  Alert and oriented to person, place and time. Affect is normal.  Laboratory Analysis: No results found for this or any previous visit (from the  past 24 hour(s)).  Imaging: Ultrasound scrotum on 10/29/2022: IMPRESSION: 1. No evidence of testicular torsion.  Both testes appear normal. 2. Small bilateral epididymal cysts or spermatoceles. 3. Small left-sided varicocele.  Assessment and Plan: This is a 27 y.o. male with a right inguinal hernia.  - Discussed with the patient the findings on exam.  I did take him a while with doing some physical exertion for the bulging to  protrude more so I could examine him better.  On initial exam, I could not palpate any defects but after she did physical activity for a few minutes, the bulging was more noticeable.  Indeed he does have a reducible right inguinal hernia.  No hernia palpable on the left groin or at the umbilicus. - Discussed with him how hernias can form and that unfortunately is no medical treatment or exercise that can be used to repair the defect.  Would recommend surgical management in the form of a right inguinal hernia repair.  Patient is in agreement.  Discussed with him that this can be done via minimally invasive robotic fashion which will allow [place] to see and evaluate the left groin as well. - Discussed with him then the plan for robotic assisted right inguinal hernia repair.  Reviewed with him the surgery at length including the planned incisions, the risks of bleeding, infection, injury to surrounding structures, that this would be an outpatient procedure, the use of mesh to patch the defect on his groin, the ability to evaluate the left groin and repair as needed at the same setting, postoperative activity restrictions, pain control, and he is willing to proceed. - We will tentatively schedule the patient for surgery on 01/24/2023.  The patient will contact his job and discussed with his wife and he is aware that if we need to push the surgery date out that it is okay to do so.  All of his questions have been answered.  I spent 45 minutes dedicated to the care of this patient on the date of this encounter to include pre-visit review of records, face-to-face time with the patient discussing diagnosis and management, and any post-visit coordination of care.   Howie Ill, MD  [facility]

## 2023-01-17 ENCOUNTER — Telehealth: Payer: Self-pay | Admitting: Surgery

## 2023-01-17 NOTE — Telephone Encounter (Signed)
Patient called back and is aware of surgery information.  

## 2023-01-17 NOTE — Telephone Encounter (Signed)
Left message for patient to call, please inform him of the following regarding scheduled surgery with Dr. Aleen Campi.   Pre-Admission date/time, and Surgery date at [facility].  Surgery Date: 01/24/23 Preadmission Testing Date: 01/21/23 (phone 8a-1p)  Also patient will need to call at [phone], between 1-3:00pm the day before surgery, to find out what time to arrive for surgery.

## 2023-01-21 ENCOUNTER — Encounter
Admission: RE | Admit: 2023-01-21 | Discharge: 2023-01-21 | Disposition: A | Discharge status: Home / Self Care | Payer: BC Managed Care – PPO | Source: Ambulatory Visit | Attending: Surgery | Admitting: Surgery

## 2023-01-21 ENCOUNTER — Other Ambulatory Visit: Payer: Self-pay

## 2023-01-21 HISTORY — DX: Sleep apnea, unspecified: G47.30

## 2023-01-21 NOTE — Patient Instructions (Addendum)
Your procedure is scheduled on: Thursday January 24, 2023. Report to the Registration Desk on the 1st floor of the [facility]. To find out your arrival time, please call [phone] between 1PM - 3PM on: Wednesday January 23, 2023. If your arrival time is 6:00 am, do not arrive before that time as the [facility] entrance doors do not open until 6:00 am.  REMEMBER: Instructions that are not followed completely may result in serious medical risk, up to and including death; or upon the discretion of your surgeon and anesthesiologist your surgery may need to be rescheduled.  Do not eat food after midnight the night before surgery.  No gum chewing or hard candies.  You may however, drink CLEAR liquids up to 2 hours before you are scheduled to arrive for your surgery. Do not drink anything within 2 hours of your scheduled arrival time.  Clear liquids include: - water  - apple juice without pulp - gatorade (not RED colors) - black coffee or tea (Do NOT add milk or creamers to the coffee or tea) Do NOT drink anything that is not on this list.   One week prior to surgery: Stop Anti-inflammatories (NSAIDS) such as Advil, Aleve, Ibuprofen, Motrin, Naproxen, Naprosyn and Aspirin based products such as Excedrin, Goody's Powder, BC Powder. Stop ANY OVER THE COUNTER supplements until after surgery. You may however, continue to take Tylenol if needed for pain up until the day of surgery.  Continue taking all prescribed medications with the exception of the following:   TAKE ONLY THESE MEDICATIONS THE MORNING OF SURGERY WITH A SIP OF WATER:  None   No Alcohol for 24 hours before or after surgery.  No Smoking including e-cigarettes for 24 hours before surgery.  No chewable tobacco products for at least 6 hours before surgery.  No nicotine patches on the day of surgery.  Do not use any "recreational" drugs for at least a week (preferably 2 weeks) before your surgery.  Please be  advised that the combination of cocaine and anesthesia may have negative outcomes, up to and including death. If you test positive for cocaine, your surgery will be cancelled.  On the morning of surgery brush your teeth with toothpaste and water, you may rinse your mouth with mouthwash if you wish. Do not swallow any toothpaste or mouthwash.  Use CHG Soap or wipes as directed on instruction sheet.  Do not wear jewelry, make-up, hairpins, clips or nail polish.  Do not wear lotions, powders, or perfumes.   Do not shave body hair from the neck down 48 hours before surgery.  Contact lenses, hearing aids and dentures may not be worn into surgery.  Do not bring valuables to the hospital. [facility] is not responsible for any missing/lost belongings or valuables.   Bring your C-PAP to the hospital in case you may have to spend the night.   Notify your doctor if there is any change in your medical condition (cold, fever, infection).  Wear comfortable clothing (specific to your surgery type) to the hospital.  After surgery, you can help prevent lung complications by doing breathing exercises.  Take deep breaths and cough every 1-2 hours. Your doctor may order a device called an Incentive Spirometer to help you take deep breaths. When coughing or sneezing, hold a pillow firmly against your incision with both hands. This is called "splinting." Doing this helps protect your incision. It also decreases belly discomfort.  If you are being admitted to the hospital overnight,  leave your suitcase in the car. After surgery it may be brought to your room.  In case of increased patient census, it may be necessary for you, the patient, to continue your postoperative care in the Same Day Surgery department.  If you are being discharged the day of surgery, you will not be allowed to drive home. You will need a responsible individual to drive you home and stay with you for 24 hours after surgery.   If  you are taking public transportation, you will need to have a responsible individual with you.  Please call the Pre-admissions Testing Dept. at [phone] if you have any questions about these instructions.  Surgery Visitation Policy:  Patients having surgery or a procedure may have two visitors.  Children under the age of 65 must have an adult with them who is not the patient.  Inpatient Visitation:    Visiting hours are 7 a.m. to 8 p.m. Up to four visitors are allowed at one time in a patient room. The visitors may rotate out with other people during the day.  One visitor age 42 or older may stay with the patient overnight and must be in the room by 8 p.m.    Preparing for Surgery with CHLORHEXIDINE GLUCONATE (CHG) Soap  Chlorhexidine Gluconate (CHG) Soap  o An antiseptic cleaner that kills germs and bonds with the skin to continue killing germs even after washing  o Used for showering the night before surgery and morning of surgery  Before surgery, you can play an important role by reducing the number of germs on your skin.  CHG (Chlorhexidine gluconate) soap is an antiseptic cleanser which kills germs and bonds with the skin to continue killing germs even after washing.  Please do not use if you have an allergy to CHG or antibacterial soaps. If your skin becomes reddened/irritated stop using the CHG.  1. Shower the NIGHT BEFORE SURGERY and the MORNING OF SURGERY with CHG soap.  2. If you choose to wash your hair, wash your hair first as usual with your normal shampoo.  3. After shampooing, rinse your hair and body thoroughly to remove the shampoo.  4. Use CHG as you would any other liquid soap. You can apply CHG directly to the skin and wash gently with a scrungie or a clean washcloth.  5. Apply the CHG soap to your body only from the neck down. Do not use on open wounds or open sores. Avoid contact with your eyes, ears, mouth, and genitals (private parts). Wash face and  genitals (private parts) with your normal soap.  6. Wash thoroughly, paying special attention to the area where your surgery will be performed.  7. Thoroughly rinse your body with warm water.  8. Do not shower/wash with your normal soap after using and rinsing off the CHG soap.  9. Pat yourself dry with a clean towel.  10. Wear clean pajamas to bed the night before surgery.  12. Place clean sheets on your bed the night of your first shower and do not sleep with pets.  13. Shower again with the CHG soap on the day of surgery prior to arriving at the hospital.  14. Do not apply any deodorants/lotions/powders.  15. Please wear clean clothes to the hospital.

## 2023-01-24 ENCOUNTER — Telehealth: Payer: Self-pay | Admitting: Surgery

## 2023-01-24 NOTE — Telephone Encounter (Signed)
Surgery rescheduled at patient's request to:     Patient has been advised of Pre-Admission date/time, and Surgery date at [facility].  Surgery Date: 01/31/23 Preadmission Testing Date: 01/21/23 (phone already done  Patient has been made aware to call [phone], between 1-3:00pm the day before surgery, to find out what time to arrive for surgery.

## 2023-01-31 ENCOUNTER — Encounter
Admission: RE | Disposition: A | Discharge status: Home / Self Care | Payer: Self-pay | Source: Home / Self Care | Attending: Surgery

## 2023-01-31 ENCOUNTER — Other Ambulatory Visit: Payer: Self-pay

## 2023-01-31 ENCOUNTER — Ambulatory Visit
Admission: RE | Admit: 2023-01-31 | Discharge: 2023-01-31 | Disposition: A | Discharge status: Home / Self Care | Payer: BC Managed Care – PPO | Attending: Surgery | Admitting: Surgery

## 2023-01-31 ENCOUNTER — Ambulatory Visit: Payer: BC Managed Care – PPO | Admitting: Anesthesiology

## 2023-01-31 ENCOUNTER — Encounter: Payer: Self-pay | Admitting: Surgery

## 2023-01-31 DIAGNOSIS — N503 Cyst of epididymis: Secondary | ICD-10-CM | POA: Insufficient documentation

## 2023-01-31 DIAGNOSIS — I861 Scrotal varices: Secondary | ICD-10-CM | POA: Insufficient documentation

## 2023-01-31 DIAGNOSIS — G4730 Sleep apnea, unspecified: Secondary | ICD-10-CM | POA: Diagnosis not present

## 2023-01-31 DIAGNOSIS — K4090 Unilateral inguinal hernia, without obstruction or gangrene, not specified as recurrent: Secondary | ICD-10-CM | POA: Diagnosis present

## 2023-01-31 SURGERY — HERNIORRHAPHY, INGUINAL, ROBOT-ASSISTED, LAPAROSCOPIC
Anesthesia: General | Laterality: Right

## 2023-01-31 MED ORDER — MIDAZOLAM HCL 2 MG/2ML IJ SOLN
INTRAMUSCULAR | Status: AC
  Filled 2023-01-31: qty 2

## 2023-01-31 MED ORDER — ROCURONIUM BROMIDE 100 MG/10ML IV SOLN
INTRAVENOUS | Status: DC | PRN
  Administered 2023-01-31: 10 mg via INTRAVENOUS
  Administered 2023-01-31: 50 mg via INTRAVENOUS
  Administered 2023-01-31 (×3): 10 mg via INTRAVENOUS

## 2023-01-31 MED ORDER — DROPERIDOL 2.5 MG/ML IJ SOLN
0.6250 mg | Freq: Once | INTRAMUSCULAR | Status: AC | PRN
  Administered 2023-01-31: 0.625 mg via INTRAVENOUS

## 2023-01-31 MED ORDER — MIDAZOLAM HCL 2 MG/2ML IJ SOLN
INTRAMUSCULAR | Status: DC | PRN
  Administered 2023-01-31: 2 mg via INTRAVENOUS

## 2023-01-31 MED ORDER — LIDOCAINE HCL (CARDIAC) PF 100 MG/5ML IV SOSY
PREFILLED_SYRINGE | INTRAVENOUS | Status: DC | PRN
  Administered 2023-01-31: 50 mg via INTRAVENOUS

## 2023-01-31 MED ORDER — CEFAZOLIN SODIUM-DEXTROSE 2-4 GM/100ML-% IV SOLN
INTRAVENOUS | Status: AC
  Filled 2023-01-31: qty 100

## 2023-01-31 MED ORDER — BUPIVACAINE LIPOSOME 1.3 % IJ SUSP: 20.0000 mL | Freq: Once | INTRAMUSCULAR | Status: DC

## 2023-01-31 MED ORDER — ACETAMINOPHEN 500 MG PO TABS
1000.0000 mg | ORAL_TABLET | ORAL | Status: AC
  Administered 2023-01-31: 1000 mg via ORAL

## 2023-01-31 MED ORDER — CHLORHEXIDINE GLUCONATE CLOTH 2 % EX PADS: 6.0000 | MEDICATED_PAD | Freq: Once | CUTANEOUS | Status: DC

## 2023-01-31 MED ORDER — ONDANSETRON HCL 4 MG/2ML IJ SOLN
INTRAMUSCULAR | Status: DC | PRN
  Administered 2023-01-31: 4 mg via INTRAVENOUS

## 2023-01-31 MED ORDER — CHLORHEXIDINE GLUCONATE 0.12 % MT SOLN
OROMUCOSAL | Status: AC
  Filled 2023-01-31: qty 15

## 2023-01-31 MED ORDER — BUPIVACAINE-EPINEPHRINE 0.5% -1:200000 IJ SOLN
INTRAMUSCULAR | Status: DC | PRN
  Administered 2023-01-31: 40 mL via INTRAMUSCULAR

## 2023-01-31 MED ORDER — ONDANSETRON HCL 4 MG/2ML IJ SOLN
INTRAMUSCULAR | Status: AC
  Filled 2023-01-31: qty 2

## 2023-01-31 MED ORDER — IBUPROFEN 600 MG PO TABS: 600.0000 mg | ORAL_TABLET | Freq: Three times a day (TID) | ORAL | 1 refills | Status: AC | PRN

## 2023-01-31 MED ORDER — FAMOTIDINE 20 MG PO TABS
20.0000 mg | ORAL_TABLET | Freq: Once | ORAL | Status: AC
  Administered 2023-01-31: 20 mg via ORAL

## 2023-01-31 MED ORDER — PROPOFOL 10 MG/ML IV BOLUS
INTRAVENOUS | Status: DC | PRN
  Administered 2023-01-31: 180 mg via INTRAVENOUS

## 2023-01-31 MED ORDER — GABAPENTIN 300 MG PO CAPS
ORAL_CAPSULE | ORAL | Status: AC
  Filled 2023-01-31: qty 1

## 2023-01-31 MED ORDER — 0.9 % SODIUM CHLORIDE (POUR BTL) OPTIME
TOPICAL | Status: DC | PRN
  Administered 2023-01-31: 500 mL

## 2023-01-31 MED ORDER — DEXAMETHASONE SODIUM PHOSPHATE 10 MG/ML IJ SOLN
INTRAMUSCULAR | Status: DC | PRN
  Administered 2023-01-31: 10 mg via INTRAVENOUS

## 2023-01-31 MED ORDER — CHLORHEXIDINE GLUCONATE 0.12 % MT SOLN
15.0000 mL | Freq: Once | OROMUCOSAL | Status: AC
  Administered 2023-01-31: 15 mL via OROMUCOSAL

## 2023-01-31 MED ORDER — EPHEDRINE SULFATE (PRESSORS) 50 MG/ML IJ SOLN
INTRAMUSCULAR | Status: DC | PRN
  Administered 2023-01-31: 5 mg via INTRAVENOUS

## 2023-01-31 MED ORDER — KETOROLAC TROMETHAMINE 30 MG/ML IJ SOLN
INTRAMUSCULAR | Status: DC | PRN
  Administered 2023-01-31: 30 mg via INTRAVENOUS

## 2023-01-31 MED ORDER — DROPERIDOL 2.5 MG/ML IJ SOLN
INTRAMUSCULAR | Status: AC
  Filled 2023-01-31: qty 2

## 2023-01-31 MED ORDER — OXYCODONE HCL 5 MG PO TABS: 5.0000 mg | ORAL_TABLET | ORAL | 0 refills | Status: AC | PRN

## 2023-01-31 MED ORDER — LACTATED RINGERS IV SOLN: INTRAVENOUS | Status: DC

## 2023-01-31 MED ORDER — BUPIVACAINE-EPINEPHRINE (PF) 0.5% -1:200000 IJ SOLN
INTRAMUSCULAR | Status: AC
  Filled 2023-01-31: qty 10

## 2023-01-31 MED ORDER — BUPIVACAINE LIPOSOME 1.3 % IJ SUSP
INTRAMUSCULAR | Status: AC
  Filled 2023-01-31: qty 10

## 2023-01-31 MED ORDER — GABAPENTIN 300 MG PO CAPS
300.0000 mg | ORAL_CAPSULE | ORAL | Status: AC
  Administered 2023-01-31: 300 mg via ORAL

## 2023-01-31 MED ORDER — BUPIVACAINE-EPINEPHRINE (PF) 0.25% -1:200000 IJ SOLN
INTRAMUSCULAR | Status: AC
  Filled 2023-01-31: qty 30

## 2023-01-31 MED ORDER — FENTANYL CITRATE (PF) 100 MCG/2ML IJ SOLN
INTRAMUSCULAR | Status: AC
  Filled 2023-01-31: qty 2

## 2023-01-31 MED ORDER — FAMOTIDINE 20 MG PO TABS
ORAL_TABLET | ORAL | Status: AC
  Filled 2023-01-31: qty 1

## 2023-01-31 MED ORDER — DEXAMETHASONE SODIUM PHOSPHATE 10 MG/ML IJ SOLN
INTRAMUSCULAR | Status: AC
  Filled 2023-01-31: qty 1

## 2023-01-31 MED ORDER — ACETAMINOPHEN 500 MG PO TABS
ORAL_TABLET | ORAL | Status: AC
  Filled 2023-01-31: qty 2

## 2023-01-31 MED ORDER — ACETAMINOPHEN 500 MG PO TABS: 1000.0000 mg | ORAL_TABLET | Freq: Four times a day (QID) | ORAL | Status: AC | PRN

## 2023-01-31 MED ORDER — FENTANYL CITRATE (PF) 100 MCG/2ML IJ SOLN: 25.0000 ug | INTRAMUSCULAR | Status: DC | PRN

## 2023-01-31 MED ORDER — CEFAZOLIN SODIUM-DEXTROSE 2-4 GM/100ML-% IV SOLN
2.0000 g | INTRAVENOUS | Status: AC
  Administered 2023-01-31: 2 g via INTRAVENOUS

## 2023-01-31 MED ORDER — SUGAMMADEX SODIUM 200 MG/2ML IV SOLN
INTRAVENOUS | Status: DC | PRN
  Administered 2023-01-31: 200 mg via INTRAVENOUS

## 2023-01-31 MED ORDER — FENTANYL CITRATE (PF) 100 MCG/2ML IJ SOLN
INTRAMUSCULAR | Status: DC | PRN
  Administered 2023-01-31 (×4): 50 ug via INTRAVENOUS

## 2023-01-31 MED ORDER — PROPOFOL 10 MG/ML IV BOLUS
INTRAVENOUS | Status: AC
  Filled 2023-01-31: qty 20

## 2023-01-31 MED ORDER — ORAL CARE MOUTH RINSE: 15.0000 mL | Freq: Once | OROMUCOSAL | Status: AC

## 2023-01-31 SURGICAL SUPPLY — 55 items
ADH SKN CLS APL DERMABOND .7 (GAUZE/BANDAGES/DRESSINGS) ×1
COVER TIP SHEARS 8 DVNC (MISCELLANEOUS) ×1 IMPLANT
COVER WAND RF STERILE (DRAPES) ×1 IMPLANT
DERMABOND ADVANCED .7 DNX12 (GAUZE/BANDAGES/DRESSINGS) ×1 IMPLANT
DRAPE ARM DVNC X/XI (DISPOSABLE) ×3 IMPLANT
DRAPE COLUMN DVNC XI (DISPOSABLE) ×1 IMPLANT
ELECT CAUTERY BLADE TIP 2.5 (TIP) ×1
ELECT REM PT RETURN 9FT ADLT (ELECTROSURGICAL) ×1
ELECTRODE CAUTERY BLDE TIP 2.5 (TIP) ×1 IMPLANT
ELECTRODE REM PT RTRN 9FT ADLT (ELECTROSURGICAL) ×1 IMPLANT
FORCEPS BPLR R/ABLATION 8 DVNC (INSTRUMENTS) ×1 IMPLANT
GLOVE SURG SYN 7.0 (GLOVE) ×2
GLOVE SURG SYN 7.0 PF PI (GLOVE) ×2 IMPLANT
GLOVE SURG SYN 7.5 E (GLOVE) ×2
GLOVE SURG SYN 7.5 PF PI (GLOVE) ×2 IMPLANT
GOWN STRL REUS W/ TWL LRG LVL3 (GOWN DISPOSABLE) ×4 IMPLANT
GOWN STRL REUS W/TWL LRG LVL3 (GOWN DISPOSABLE) ×4
IRRIGATION STRYKERFLOW (MISCELLANEOUS) ×1 IMPLANT
IRRIGATOR STRYKERFLOW (MISCELLANEOUS) ×1
IV NS 1000ML (IV SOLUTION)
IV NS 1000ML BAXH (IV SOLUTION) IMPLANT
KIT PINK PAD W/HEAD ARE REST (MISCELLANEOUS) ×1
KIT PINK PAD W/HEAD ARM REST (MISCELLANEOUS) ×1 IMPLANT
LABEL OR SOLS (LABEL) ×1 IMPLANT
MANIFOLD NEPTUNE II (INSTRUMENTS) ×1 IMPLANT
MESH 3DMAX MID 4X6 RT LRG (Mesh General) IMPLANT
NDL DRIVE SUT CUT DVNC (INSTRUMENTS) ×1 IMPLANT
NDL HYPO 22X1.5 SAFETY MO (MISCELLANEOUS) ×1 IMPLANT
NDL INSUFFLATION 14GA 120MM (NEEDLE) ×1 IMPLANT
NEEDLE DRIVE SUT CUT DVNC (INSTRUMENTS) ×1
NEEDLE HYPO 22X1.5 SAFETY MO (MISCELLANEOUS) ×1
NEEDLE INSUFFLATION 14GA 120MM (NEEDLE) ×1
OBTURATOR OPTICAL STND 8 DVNC (TROCAR) ×1
OBTURATOR OPTICALSTD 8 DVNC (TROCAR) ×1 IMPLANT
PACK LAP CHOLECYSTECTOMY (MISCELLANEOUS) ×1 IMPLANT
PENCIL SMOKE EVACUATOR (MISCELLANEOUS) ×1 IMPLANT
SCISSORS MNPLR CVD DVNC XI (INSTRUMENTS) ×1 IMPLANT
SEAL UNIV 5-12 XI (MISCELLANEOUS) ×3 IMPLANT
SET TUBE SMOKE EVAC HIGH FLOW (TUBING) ×1 IMPLANT
SOL ELECTROSURG ANTI STICK (MISCELLANEOUS) ×1
SOLUTION ELECTROSURG ANTI STCK (MISCELLANEOUS) ×1 IMPLANT
SPONGE T-LAP 18X18 ~~LOC~~+RFID (SPONGE) ×1 IMPLANT
SUT MNCRL AB 4-0 PS2 18 (SUTURE) ×1 IMPLANT
SUT VIC AB 2-0 SH 27 (SUTURE) ×2
SUT VIC AB 2-0 SH 27XBRD (SUTURE) ×2 IMPLANT
SUT VIC AB 3-0 SH 27 (SUTURE)
SUT VIC AB 3-0 SH 27X BRD (SUTURE) IMPLANT
SUT VICRYL 0 UR6 27IN ABS (SUTURE) ×2 IMPLANT
SUT VLOC 90 S/L VL9 GS22 (SUTURE) ×1 IMPLANT
SYS BAG RETRIEVAL 10MM (BASKET) ×1
SYSTEM BAG RETRIEVAL 10MM (BASKET) IMPLANT
TAPE TRANSPORE STRL 2 31045 (GAUZE/BANDAGES/DRESSINGS) ×1 IMPLANT
TRAP FLUID SMOKE EVACUATOR (MISCELLANEOUS) ×1 IMPLANT
TRAY FOLEY SLVR 16FR LF STAT (SET/KITS/TRAYS/PACK) ×1 IMPLANT
WATER STERILE IRR 500ML POUR (IV SOLUTION) ×1 IMPLANT

## 2023-01-31 NOTE — Transfer of Care (Signed)
Immediate Anesthesia Transfer of Care Note  Patient: Bryce Levy  Procedure(s) Performed: XI ROBOTIC ASSISTED INGUINAL HERNIA (Right)  Patient Location: PACU  Anesthesia Type:General  Level of Consciousness: drowsy and patient cooperative  Airway & Oxygen Therapy: Patient Spontanous Breathing and Patient connected to face mask oxygen  Post-op Assessment: Report given to RN, Post -op Vital signs reviewed and stable, and Patient moving all extremities X 4  Post vital signs: Reviewed and stable  Last Vitals:  Vitals Value Taken Time  BP 126/61 01/31/23 1207  Temp    Pulse 76 01/31/23 1211  Resp 16 01/31/23 1211  SpO2 98 % 01/31/23 1211  Vitals shown include unfiled device data.  Last Pain:  Vitals:   01/31/23 0911  PainSc: 0-No pain         Complications: No notable events documented.

## 2023-01-31 NOTE — Anesthesia Postprocedure Evaluation (Signed)
Anesthesia Post Note  Patient: Bryce Levy  Procedure(s) Performed: XI ROBOTIC ASSISTED INGUINAL HERNIA (Right)  Patient location during evaluation: PACU Anesthesia Type: General Level of consciousness: awake and alert Pain management: pain level controlled Vital Signs Assessment: post-procedure vital signs reviewed and stable Respiratory status: spontaneous breathing, nonlabored ventilation, respiratory function stable and patient connected to nasal cannula oxygen Cardiovascular status: blood pressure returned to baseline and stable Postop Assessment: no apparent nausea or vomiting Anesthetic complications: no   No notable events documented.   Last Vitals:  Vitals:   01/31/23 1305 01/31/23 1315  BP:  131/69  Pulse: 93 90  Resp:  16  Temp:  (!) 36.2 C  SpO2: 99% 100%    Last Pain:  Vitals:   01/31/23 1315  TempSrc: Temporal  PainSc: 0-No pain                 Lenard Simmer

## 2023-01-31 NOTE — Anesthesia Preprocedure Evaluation (Signed)
Anesthesia Evaluation  Patient identified by MRN, date of birth, ID band Patient awake    Reviewed: Allergy & Precautions, H&P , NPO status , Patient's Chart, lab work & pertinent test results, reviewed documented beta blocker date and time   History of Anesthesia Complications Negative for: history of anesthetic complications  Airway Mallampati: I  TM Distance: <3 FB Neck ROM: full    Dental  (+) Dental Advidsory Given, Teeth Intact   Pulmonary neg shortness of breath, sleep apnea and Continuous Positive Airway Pressure Ventilation , neg COPD, neg recent URI   Pulmonary exam normal breath sounds clear to auscultation       Cardiovascular Exercise Tolerance: Good negative cardio ROS Normal cardiovascular exam Rhythm:regular Rate:Normal     Neuro/Psych negative neurological ROS  negative psych ROS   GI/Hepatic negative GI ROS, Neg liver ROS,,,  Endo/Other  negative endocrine ROS    Renal/GU negative Renal ROS  negative genitourinary   Musculoskeletal   Abdominal   Peds  Hematology negative hematology ROS (+)   Anesthesia Other Findings Past Medical History: No date: Sleep apnea   Reproductive/Obstetrics negative OB ROS                             Anesthesia Physical Anesthesia Plan  ASA: 2  Anesthesia Plan: General   Post-op Pain Management:    Induction: Intravenous  PONV Risk Score and Plan: 2 and Ondansetron, Dexamethasone, Midazolam and Treatment may vary due to age or medical condition  Airway Management Planned: Oral ETT  Additional Equipment:   Intra-op Plan:   Post-operative Plan: Extubation in OR  Informed Consent: I have reviewed the patients History and Physical, chart, labs and discussed the procedure including the risks, benefits and alternatives for the proposed anesthesia with the patient or authorized representative who has indicated his/her understanding  and acceptance.     Dental Advisory Given  Plan Discussed with: Anesthesiologist, CRNA and Surgeon  Anesthesia Plan Comments:         Anesthesia Quick Evaluation

## 2023-01-31 NOTE — Interval H&P Note (Signed)
History and Physical Interval Note:  01/31/2023 9:28 AM  Bryce Levy  has presented today for surgery, with the diagnosis of right inguinal hernia reducible.  The various methods of treatment have been discussed with the patient and family. After consideration of risks, benefits and other options for treatment, the patient has consented to  Procedure(s): XI ROBOTIC ASSISTED INGUINAL HERNIA (Right) as a surgical intervention.  The patient's history has been reviewed, patient examined, no change in status, stable for surgery.  I have reviewed the patient's chart and labs.  Questions were answered to the patient's satisfaction.     Danayah Smyre

## 2023-01-31 NOTE — Op Note (Signed)
Procedure Date:  01/31/2023  Pre-operative Diagnosis:  Right inguinal hernia  Post-operative Diagnosis: Right inguinal hernia  Procedure: 1.  Robotic assisted right Inguinal Hernia Repair 2.  Creation of right Posterior Rectus-Transversalis Fascia Advancment Flap for Coverage of Pelvic Wound (200 cm)  Surgeon:  Howie Ill, MD  Anesthesia:  General endotracheal  Estimated Blood Loss:  10 ml  Specimens:  None  Complications:  None  Indications for Procedure:  This is a 27 y.o. male who presents with a right inguinal hernia.  The options of surgery versus observation were reviewed with the patient and/or family. The risks of bleeding, abscess or infection, recurrence of symptoms, potential for an open procedure, injury to surrounding structures, and chronic pain were all discussed with the patient and he was willing to proceed.  We have planned this transabdominal procedure with the creation of a right peritoneal flap based on the posterior rectus sheath and transversalis fascia in order to fully cover the mesh, creating a natural tisssue barrier for the bowel and peritoneal cavity.  Description of Procedure: The patient was correctly identified in the preoperative area and brought into the operating room.  The patient was placed supine with VTE prophylaxis in place.  Appropriate time-outs were performed.  Anesthesia was induced and the patient was intubated.  Foley catheter was placed.  Appropriate antibiotics were infused.  The abdomen was prepped and draped in a sterile fashion. A supraumbilical incision was made. A cutdown technique was used to enter the abdominal cavity without injury, and a Hasson trocar was inserted.  Pneumoperitoneum was obtained with appropriate opening pressures.  A Veress needle was used to start dissecting the peritoneal flap.  Two 8-mm robotic ports were placed in the right and left lateral positions under direct visualization.  A large right Bard 3D Max  Mid Mesh, a 2-0 Vicryl, and 2-0 vlock suture were placed through the umbilical port under direct visualization.  The [employer] platform was docked onto the patient, the camera was inserted and targeted, and the instruments were placed under direct visualization.  Both inguinal regions were inspected for hernias and it was confirmed that the patient had a right indirect inguinal hernia.  Using electocautery, the peritoneal and posterior rectus tissue flap was created.  The peritoneum on the right side was scored from the median umbilical ligament laterally towards the ASIS.  The flap was mobilized using robotic scissors and the bipolar instruments, creating a plane along the posterior rectus sheath and transversalis fascia down to the pubic tubercle medially. It was then further mobilized laterally across the inguinal canal and femoral vessels and onto the psoas muscle. The inferior epigastric vessels were identified and preserved. This created a posterior rectus and peritoneal flap measuring roughly 17 cm x 12 cm.  The hernia sac and contents were reduced preserving all structures.  A large right Bard 3D Max Mid mesh was placed with good overlap along all the potential hernia defects and secured in place with 2-0 Vicryl along the medial superomedial and superolateral aspects.  Then, the peritoneal flap was advanced over the mesh and carried over to close the defect. A running 2-0 V lock suture was used to approximate the edge of the flap onto the peritoneum.  All needles were removed under direct visualization.  The 8- mm ports were removed under direct visualization and the Hasson trocar was removed.  The fascial opening was closed using 0 vicryl suture.  Local anesthetic was infused in all incisions as well as  a right ilioinguinal block, and the incisions were closed with 4-0 Monocryl.  The wounds were cleaned and sealed with DermaBond.  Foley catheter was removed and the patient was emerged from anesthesia  and extubated and brought to the recovery room for further management.  The patient tolerated the procedure well and all counts were correct at the end of the case.   Howie Ill, MD

## 2023-01-31 NOTE — Anesthesia Procedure Notes (Signed)
Procedure Name: Intubation Date/Time: 01/31/2023 9:52 AM  Performed by: Lanell Matar, CRNAPre-anesthesia Checklist: Patient identified, Emergency Drugs available, Suction available and Patient being monitored Patient Re-evaluated:Patient Re-evaluated prior to induction Oxygen Delivery Method: Circle System Utilized Preoxygenation: Pre-oxygenation with 100% oxygen Induction Type: IV induction Ventilation: Mask ventilation without difficulty Laryngoscope Size: McGraph and 4 Grade View: Grade I Tube type: Oral Tube size: 7.5 mm Number of attempts: 1 Airway Equipment and Method: Stylet and Oral airway Placement Confirmation: ETT inserted through vocal cords under direct vision, positive ETCO2 and breath sounds checked- equal and bilateral Secured at: 21 cm Tube secured with: Tape Dental Injury: Teeth and Oropharynx as per pre-operative assessment

## 2023-01-31 NOTE — Discharge Instructions (Addendum)
Discharge Instructions: 1.  Patient may shower, but do not scrub wounds heavily and dab dry only. 2.  Do not submerge wounds in pool/tub until fully healed. 3.  Do not apply ointments or hydrogen peroxide to the wounds. 4.  May apply ice packs to the wounds for comfort. 5.  It is normal for there to be bruising or swelling in the groin and scrotum after this surgery.  This will resolve on its own. 6.  Do not drive while taking narcotics for pain control.  Prior to driving, make sure you are able to rotate right and left to look at blindspots without significant pain or discomfort. 7. No heavy lifting or pushing of more than 10-15 lbs for 6 weeks.   AMBULATORY SURGERY  DISCHARGE INSTRUCTIONS   The drugs that you were given will stay in your system until tomorrow so for the next 24 hours you should not:  Drive an automobile Make any legal decisions Drink any alcoholic beverage   You may resume regular meals tomorrow.  Today it is better to start with liquids and gradually work up to solid foods.  You may eat anything you prefer, but it is better to start with liquids, then soup and crackers, and gradually work up to solid foods.   Please notify your doctor immediately if you have any unusual bleeding, trouble breathing, redness and pain at the surgery site, drainage, fever, or pain not relieved by medication.    Additional Instructions:        Please contact your physician with any problems or Same Day Surgery at [phone], Monday through Friday 6 am to 4 pm, or [place] at [facility] number at [phone].

## 2023-02-11 ENCOUNTER — Encounter: Payer: Self-pay | Admitting: Surgery

## 2023-02-13 ENCOUNTER — Encounter: Payer: Self-pay | Admitting: Physician Assistant

## 2023-02-13 ENCOUNTER — Ambulatory Visit (INDEPENDENT_AMBULATORY_CARE_PROVIDER_SITE_OTHER): Payer: BC Managed Care – PPO | Admitting: Physician Assistant

## 2023-02-13 VITALS — BP 130/78 | HR 86 | Temp 98.6°F | Ht 66.0 in | Wt 155.4 lb

## 2023-02-13 DIAGNOSIS — Z09 Encounter for follow-up examination after completed treatment for conditions other than malignant neoplasm: Secondary | ICD-10-CM

## 2023-02-13 DIAGNOSIS — K4090 Unilateral inguinal hernia, without obstruction or gangrene, not specified as recurrent: Secondary | ICD-10-CM

## 2023-02-13 NOTE — Progress Notes (Signed)
[facility] POST-OP OFFICE VISIT  02/13/2023  HPI: Bryce Levy is a 27 y.o. male 13 days s/p robotic assisted laparoscopic RIH repair with Dr Aleen Campi   He is overall doing well Still with right groin soreness; mild swelling This is improving daily No fever, chills, nausea, emesis Had some constipation at first; this is better No issues with incisions Ambulating well   Vital signs: BP 130/78 (BP Location: Right Arm, Patient Position: Sitting, Cuff Size: Small)   Pulse 86   Temp 98.6 F (37 C) (Oral)   Ht 5\' 6"  (1.676 m)   Wt 155 lb 6.4 oz (70.5 kg)   SpO2 99%   BMI 25.08 kg/m    Physical Exam: Constitutional: Well appearing male, NAD Abdomen: Soft, non-tender, non-distended, no rebound/guarding Skin: Laparoscopic incisions are healing well, no erythema or drainage   Assessment/Plan: This is a 27 y.o. male 13 days s/p robotic assisted laparoscopic RIH repair with Dr Aleen Campi    - Pain control prn  - Reviewed wound care recommendation  - Reviewed lifting restrictions; 6 weeks total - updated work note  - He can follow up on as needed basis; He understands to call with questions/concerns  -- Lynden Oxford, PA-C [facility] 02/13/2023, 3:08 PM M-F: 7am - 4pm

## 2023-02-13 NOTE — Patient Instructions (Signed)

## 2023-02-14 ENCOUNTER — Telehealth: Payer: Self-pay | Admitting: *Deleted

## 2023-02-14 NOTE — Telephone Encounter (Signed)
Faxed FMLA to [facility] at [phone]
